# Patient Record
Sex: Female | Born: 1965 | Race: White | Hispanic: No | Marital: Married | Smoking: Never smoker
Health system: Southern US, Community
[De-identification: ages and names within clinical notes are randomized; demographics above are authoritative.]

## PROBLEM LIST (undated history)

## (undated) DIAGNOSIS — R112 Nausea with vomiting, unspecified: Secondary | ICD-10-CM

## (undated) DIAGNOSIS — F419 Anxiety disorder, unspecified: Secondary | ICD-10-CM

## (undated) DIAGNOSIS — Z9889 Other specified postprocedural states: Secondary | ICD-10-CM

## (undated) DIAGNOSIS — G43909 Migraine, unspecified, not intractable, without status migrainosus: Secondary | ICD-10-CM

## (undated) HISTORY — PX: CATARACT EXTRACTION: SUR2

## (undated) HISTORY — PX: CARPAL TUNNEL RELEASE: SHX101

## (undated) HISTORY — PX: OTHER SURGICAL HISTORY: SHX169

## (undated) HISTORY — PX: YAG LASER APPLICATION: SHX6189

---

## 2018-10-18 ENCOUNTER — Ambulatory Visit (HOSPITAL_COMMUNITY)
Admission: RE | Admit: 2018-10-18 | Discharge: 2018-10-18 | Disposition: A | Discharge status: Home / Self Care | Payer: BC Managed Care – PPO | Source: Ambulatory Visit | Attending: Oral Surgery | Admitting: Oral Surgery

## 2018-10-18 ENCOUNTER — Inpatient Hospital Stay (HOSPITAL_COMMUNITY): Payer: BC Managed Care – PPO | Admitting: Certified Registered"

## 2018-10-18 ENCOUNTER — Other Ambulatory Visit: Payer: Self-pay | Admitting: Oral Surgery

## 2018-10-18 ENCOUNTER — Encounter (HOSPITAL_COMMUNITY): Payer: Self-pay | Admitting: *Deleted

## 2018-10-18 ENCOUNTER — Other Ambulatory Visit (HOSPITAL_COMMUNITY)
Admission: RE | Admit: 2018-10-18 | Discharge: 2018-10-18 | Disposition: A | Discharge status: Home / Self Care | Payer: BC Managed Care – PPO | Source: Ambulatory Visit

## 2018-10-18 ENCOUNTER — Other Ambulatory Visit: Payer: Self-pay

## 2018-10-18 ENCOUNTER — Encounter (HOSPITAL_COMMUNITY)
Admission: RE | Disposition: A | Discharge status: Home / Self Care | Payer: Self-pay | Source: Ambulatory Visit | Attending: Oral Surgery

## 2018-10-18 ENCOUNTER — Inpatient Hospital Stay (HOSPITAL_COMMUNITY)
Admission: RE | Admit: 2018-10-18 | Discharge: 2018-10-22 | DRG: 137 | Disposition: A | Discharge status: Home / Self Care | Payer: BC Managed Care – PPO | Source: Ambulatory Visit | Attending: Oral Surgery | Admitting: Oral Surgery

## 2018-10-18 DIAGNOSIS — B9689 Other specified bacterial agents as the cause of diseases classified elsewhere: Secondary | ICD-10-CM | POA: Diagnosis present

## 2018-10-18 DIAGNOSIS — R220 Localized swelling, mass and lump, head: Secondary | ICD-10-CM | POA: Insufficient documentation

## 2018-10-18 DIAGNOSIS — Z20828 Contact with and (suspected) exposure to other viral communicable diseases: Secondary | ICD-10-CM | POA: Diagnosis present

## 2018-10-18 DIAGNOSIS — Z01812 Encounter for preprocedural laboratory examination: Secondary | ICD-10-CM | POA: Insufficient documentation

## 2018-10-18 DIAGNOSIS — K122 Cellulitis and abscess of mouth: Principal | ICD-10-CM | POA: Diagnosis present

## 2018-10-18 DIAGNOSIS — F419 Anxiety disorder, unspecified: Secondary | ICD-10-CM | POA: Diagnosis present

## 2018-10-18 DIAGNOSIS — J390 Retropharyngeal and parapharyngeal abscess: Secondary | ICD-10-CM | POA: Diagnosis present

## 2018-10-18 DIAGNOSIS — Z6841 Body Mass Index (BMI) 40.0 and over, adult: Secondary | ICD-10-CM

## 2018-10-18 HISTORY — DX: Other specified postprocedural states: R11.2

## 2018-10-18 HISTORY — DX: Other specified postprocedural states: Z98.890

## 2018-10-18 HISTORY — PX: INCISION AND DRAINAGE OF PERITONSILLAR ABCESS: SHX6257

## 2018-10-18 HISTORY — DX: Anxiety disorder, unspecified: F41.9

## 2018-10-18 HISTORY — DX: Migraine, unspecified, not intractable, without status migrainosus: G43.909

## 2018-10-18 LAB — SARS CORONAVIRUS 2 BY RT PCR (HOSPITAL ORDER, PERFORMED IN ~~LOC~~ HOSPITAL LAB): SARS Coronavirus 2: NEGATIVE

## 2018-10-18 LAB — CBC
HCT: 38.2 % (ref 36.0–46.0)
Hemoglobin: 13.4 g/dL (ref 12.0–15.0)
MCH: 32.2 pg (ref 26.0–34.0)
MCHC: 35.1 g/dL (ref 30.0–36.0)
MCV: 91.8 fL (ref 80.0–100.0)
Platelets: 367 10*3/uL (ref 150–400)
RBC: 4.16 MIL/uL (ref 3.87–5.11)
RDW: 11.2 % — ABNORMAL LOW (ref 11.5–15.5)
WBC: 17.4 10*3/uL — ABNORMAL HIGH (ref 4.0–10.5)
nRBC: 0 % (ref 0.0–0.2)

## 2018-10-18 LAB — BASIC METABOLIC PANEL
Anion gap: 13 (ref 5–15)
BUN: 11 mg/dL (ref 6–20)
CO2: 26 mmol/L (ref 22–32)
Calcium: 9 mg/dL (ref 8.9–10.3)
Chloride: 90 mmol/L — ABNORMAL LOW (ref 98–111)
Creatinine, Ser: 0.88 mg/dL (ref 0.44–1.00)
GFR calc Af Amer: >60 mL/min (ref >60)
GFR calc non Af Amer: >60 mL/min (ref >60)
Glucose, Bld: 119 mg/dL — ABNORMAL HIGH (ref 70–99)
Potassium: 4 mmol/L (ref 3.5–5.1)
Sodium: 129 mmol/L — ABNORMAL LOW (ref 135–145)

## 2018-10-18 SURGERY — INCISION AND DRAINAGE, ABSCESS, PERITONSILLAR
Anesthesia: General | Site: Face | Laterality: Right

## 2018-10-18 MED ORDER — ESCITALOPRAM OXALATE 20 MG PO TABS
20.0000 mg | ORAL_TABLET | Freq: Every day | ORAL | Status: DC
  Administered 2018-10-19 – 2018-10-21 (×3): 20 mg via ORAL
  Filled 2018-10-18 (×3): qty 1

## 2018-10-18 MED ORDER — SUCCINYLCHOLINE CHLORIDE 20 MG/ML IJ SOLN
INTRAMUSCULAR | Status: DC | PRN
  Administered 2018-10-18: 120 mg via INTRAVENOUS

## 2018-10-18 MED ORDER — ACETAMINOPHEN 10 MG/ML IV SOLN
INTRAVENOUS | Status: AC
  Administered 2018-10-18: 1000 mg via INTRAVENOUS
  Filled 2018-10-18: qty 100

## 2018-10-18 MED ORDER — HYDROMORPHONE HCL 1 MG/ML IJ SOLN
0.5000 mg | INTRAMUSCULAR | Status: DC | PRN
  Administered 2018-10-20 (×2): 0.5 mg via INTRAVENOUS
  Filled 2018-10-18 (×2): qty 1

## 2018-10-18 MED ORDER — PROMETHAZINE HCL 25 MG/ML IJ SOLN: 6.2500 mg | INTRAMUSCULAR | Status: DC | PRN

## 2018-10-18 MED ORDER — DEXAMETHASONE SODIUM PHOSPHATE 10 MG/ML IJ SOLN
INTRAMUSCULAR | Status: DC | PRN
  Administered 2018-10-18: 10 mg via INTRAVENOUS

## 2018-10-18 MED ORDER — LIDOCAINE-EPINEPHRINE 2 %-1:100000 IJ SOLN
INTRAMUSCULAR | Status: DC | PRN
  Administered 2018-10-18: 15 mL via INTRADERMAL

## 2018-10-18 MED ORDER — LIDOCAINE-EPINEPHRINE 1 %-1:100000 IJ SOLN
INTRAMUSCULAR | Status: AC
  Filled 2018-10-18: qty 1

## 2018-10-18 MED ORDER — LORATADINE 10 MG PO TABS
10.0000 mg | ORAL_TABLET | Freq: Every day | ORAL | Status: DC
  Administered 2018-10-19 – 2018-10-21 (×3): 10 mg via ORAL
  Filled 2018-10-18 (×3): qty 1

## 2018-10-18 MED ORDER — FENTANYL CITRATE (PF) 100 MCG/2ML IJ SOLN
INTRAMUSCULAR | Status: AC
  Administered 2018-10-18: 100 ug via INTRAVENOUS
  Filled 2018-10-18: qty 2

## 2018-10-18 MED ORDER — ZOLPIDEM TARTRATE 5 MG PO TABS
5.0000 mg | ORAL_TABLET | Freq: Every evening | ORAL | Status: DC | PRN
  Administered 2018-10-20: 5 mg via ORAL
  Filled 2018-10-18: qty 1

## 2018-10-18 MED ORDER — SUGAMMADEX SODIUM 200 MG/2ML IV SOLN
INTRAVENOUS | Status: DC | PRN
  Administered 2018-10-18: 200 mg via INTRAVENOUS

## 2018-10-18 MED ORDER — SODIUM CHLORIDE (PF) 0.9 % IJ SOLN
INTRAMUSCULAR | Status: AC
  Filled 2018-10-18: qty 50

## 2018-10-18 MED ORDER — DIPHENHYDRAMINE HCL 50 MG/ML IJ SOLN: 25.0000 mg | Freq: Four times a day (QID) | INTRAMUSCULAR | Status: DC | PRN

## 2018-10-18 MED ORDER — DEXAMETHASONE SODIUM PHOSPHATE 10 MG/ML IJ SOLN
INTRAMUSCULAR | Status: AC
  Filled 2018-10-18: qty 1

## 2018-10-18 MED ORDER — METOPROLOL TARTRATE 5 MG/5ML IV SOLN: 5.0000 mg | Freq: Four times a day (QID) | INTRAVENOUS | Status: DC | PRN

## 2018-10-18 MED ORDER — PROPOFOL 10 MG/ML IV BOLUS
INTRAVENOUS | Status: DC | PRN
  Administered 2018-10-18: 200 mg via INTRAVENOUS

## 2018-10-18 MED ORDER — LACTATED RINGERS IV SOLN
INTRAVENOUS | Status: DC | PRN
  Administered 2018-10-18: 18:00:00 via INTRAVENOUS

## 2018-10-18 MED ORDER — ROCURONIUM BROMIDE 100 MG/10ML IV SOLN
INTRAVENOUS | Status: DC | PRN
  Administered 2018-10-18: 30 mg via INTRAVENOUS

## 2018-10-18 MED ORDER — LIDOCAINE-EPINEPHRINE 2 %-1:100000 IJ SOLN
INTRAMUSCULAR | Status: AC
  Filled 2018-10-18: qty 1

## 2018-10-18 MED ORDER — ACETAMINOPHEN 500 MG PO TABS
ORAL_TABLET | ORAL | Status: AC
  Filled 2018-10-18: qty 2

## 2018-10-18 MED ORDER — FENTANYL CITRATE (PF) 250 MCG/5ML IJ SOLN
INTRAMUSCULAR | Status: AC
  Filled 2018-10-18: qty 5

## 2018-10-18 MED ORDER — OXYCODONE HCL 5 MG PO TABS
5.0000 mg | ORAL_TABLET | ORAL | Status: DC | PRN
  Administered 2018-10-18 – 2018-10-22 (×13): 10 mg via ORAL
  Filled 2018-10-18 (×13): qty 2

## 2018-10-18 MED ORDER — SODIUM CHLORIDE 0.9 % IR SOLN
Status: DC | PRN
  Administered 2018-10-18: 1000 mL

## 2018-10-18 MED ORDER — ONDANSETRON 4 MG PO TBDP: 4.0000 mg | ORAL_TABLET | Freq: Four times a day (QID) | ORAL | Status: DC | PRN

## 2018-10-18 MED ORDER — HYDROXYCHLOROQUINE SULFATE 200 MG PO TABS
200.0000 mg | ORAL_TABLET | Freq: Two times a day (BID) | ORAL | Status: DC
  Administered 2018-10-18 – 2018-10-21 (×7): 200 mg via ORAL
  Filled 2018-10-18 (×7): qty 1

## 2018-10-18 MED ORDER — LACTATED RINGERS IV SOLN
INTRAVENOUS | Status: DC
  Administered 2018-10-18: 17:00:00 via INTRAVENOUS

## 2018-10-18 MED ORDER — PROPOFOL 10 MG/ML IV BOLUS
INTRAVENOUS | Status: AC
  Filled 2018-10-18: qty 20

## 2018-10-18 MED ORDER — AMITRIPTYLINE HCL 50 MG PO TABS
50.0000 mg | ORAL_TABLET | Freq: Two times a day (BID) | ORAL | Status: DC
  Administered 2018-10-18 – 2018-10-21 (×7): 50 mg via ORAL
  Filled 2018-10-18 (×7): qty 1

## 2018-10-18 MED ORDER — ONDANSETRON HCL 4 MG/2ML IJ SOLN
INTRAMUSCULAR | Status: AC
  Filled 2018-10-18: qty 4

## 2018-10-18 MED ORDER — ACETAMINOPHEN 500 MG PO TABS
1000.0000 mg | ORAL_TABLET | Freq: Four times a day (QID) | ORAL | Status: DC
  Administered 2018-10-19 – 2018-10-22 (×12): 1000 mg via ORAL
  Filled 2018-10-18 (×13): qty 2

## 2018-10-18 MED ORDER — FENTANYL CITRATE (PF) 100 MCG/2ML IJ SOLN: 25.0000 ug | INTRAMUSCULAR | Status: DC | PRN

## 2018-10-18 MED ORDER — DEXTROSE-NACL 5-0.45 % IV SOLN
INTRAVENOUS | Status: DC
  Administered 2018-10-18 – 2018-10-20 (×4): via INTRAVENOUS

## 2018-10-18 MED ORDER — LORAZEPAM 0.5 MG PO TABS
0.5000 mg | ORAL_TABLET | Freq: Two times a day (BID) | ORAL | Status: DC | PRN
  Administered 2018-10-19 – 2018-10-21 (×3): 0.5 mg via ORAL
  Filled 2018-10-18 (×3): qty 1

## 2018-10-18 MED ORDER — DIPHENHYDRAMINE HCL 25 MG PO CAPS
25.0000 mg | ORAL_CAPSULE | Freq: Four times a day (QID) | ORAL | Status: DC | PRN
  Administered 2018-10-21: 25 mg via ORAL
  Filled 2018-10-18: qty 1

## 2018-10-18 MED ORDER — MIDAZOLAM HCL 2 MG/2ML IJ SOLN
INTRAMUSCULAR | Status: AC
  Filled 2018-10-18: qty 2

## 2018-10-18 MED ORDER — ONDANSETRON HCL 4 MG/2ML IJ SOLN: 4.0000 mg | Freq: Four times a day (QID) | INTRAMUSCULAR | Status: DC | PRN

## 2018-10-18 MED ORDER — FENTANYL CITRATE (PF) 100 MCG/2ML IJ SOLN
100.0000 ug | Freq: Once | INTRAMUSCULAR | Status: AC
  Administered 2018-10-18: 18:00:00 100 ug via INTRAVENOUS

## 2018-10-18 MED ORDER — FENTANYL CITRATE (PF) 250 MCG/5ML IJ SOLN
INTRAMUSCULAR | Status: DC | PRN
  Administered 2018-10-18 (×3): 50 ug via INTRAVENOUS

## 2018-10-18 MED ORDER — ACETAMINOPHEN 10 MG/ML IV SOLN
1000.0000 mg | Freq: Once | INTRAVENOUS | Status: AC
  Administered 2018-10-18: 17:00:00 1000 mg via INTRAVENOUS

## 2018-10-18 MED ORDER — LIDOCAINE 2% (20 MG/ML) 5 ML SYRINGE
INTRAMUSCULAR | Status: DC | PRN
  Administered 2018-10-18: 100 mg via INTRAVENOUS

## 2018-10-18 MED ORDER — ONDANSETRON HCL 4 MG/2ML IJ SOLN
INTRAMUSCULAR | Status: DC | PRN
  Administered 2018-10-18: 4 mg via INTRAVENOUS

## 2018-10-18 MED ORDER — SODIUM CHLORIDE 0.9 % IV SOLN
3.0000 g | Freq: Four times a day (QID) | INTRAVENOUS | Status: DC
  Administered 2018-10-18 – 2018-10-22 (×14): 3 g via INTRAVENOUS
  Filled 2018-10-18: qty 8
  Filled 2018-10-18 (×7): qty 3
  Filled 2018-10-18 (×2): qty 8
  Filled 2018-10-18 (×5): qty 3
  Filled 2018-10-18: qty 8
  Filled 2018-10-18 (×2): qty 3

## 2018-10-18 MED ORDER — IOHEXOL 300 MG/ML  SOLN
75.0000 mL | Freq: Once | INTRAMUSCULAR | Status: AC | PRN
  Administered 2018-10-18: 16:00:00 75 mL via INTRAVENOUS

## 2018-10-18 SURGICAL SUPPLY — 39 items
BLADE SURG 15 STRL LF DISP TIS (BLADE) ×1 IMPLANT
BLADE SURG 15 STRL SS (BLADE) ×2
BNDG CONFORM 2 STRL LF (GAUZE/BANDAGES/DRESSINGS) ×3 IMPLANT
CATH ROBINSON RED A/P 18FR (CATHETERS) ×2 IMPLANT
COVER SURGICAL LIGHT HANDLE (MISCELLANEOUS) ×6 IMPLANT
COVER WAND RF STERILE (DRAPES) ×3 IMPLANT
DRAIN PENROSE 1/4X12 LTX STRL (WOUND CARE) ×3 IMPLANT
DRSG PAD ABDOMINAL 8X10 ST (GAUZE/BANDAGES/DRESSINGS) IMPLANT
ELECT COATED BLADE 2.86 ST (ELECTRODE) IMPLANT
ELECT REM PT RETURN 9FT ADLT (ELECTROSURGICAL) ×3
ELECTRODE REM PT RTRN 9FT ADLT (ELECTROSURGICAL) ×1 IMPLANT
GAUZE 4X4 16PLY RFD (DISPOSABLE) ×3 IMPLANT
GAUZE PACKING FOLDED 2  STR (GAUZE/BANDAGES/DRESSINGS) ×2
GAUZE PACKING FOLDED 2 STR (GAUZE/BANDAGES/DRESSINGS) IMPLANT
GAUZE SPONGE 4X4 12PLY STRL (GAUZE/BANDAGES/DRESSINGS) IMPLANT
GAUZE SPONGE 4X4 12PLY STRL LF (GAUZE/BANDAGES/DRESSINGS) ×2 IMPLANT
GLOVE BIO SURGEON STRL SZ7.5 (GLOVE) ×3 IMPLANT
GOWN STRL REUS W/ TWL LRG LVL3 (GOWN DISPOSABLE) ×1 IMPLANT
GOWN STRL REUS W/TWL LRG LVL3 (GOWN DISPOSABLE) ×2
KIT BASIN OR (CUSTOM PROCEDURE TRAY) ×3 IMPLANT
KIT TURNOVER KIT B (KITS) ×3 IMPLANT
MARKER SKIN DUAL TIP RULER LAB (MISCELLANEOUS) ×3 IMPLANT
NDL HYPO 25GX1X1/2 BEV (NEEDLE) ×1 IMPLANT
NEEDLE HYPO 25GX1X1/2 BEV (NEEDLE) ×3 IMPLANT
NS IRRIG 1000ML POUR BTL (IV SOLUTION) ×3 IMPLANT
PACK SURGICAL SETUP 50X90 (CUSTOM PROCEDURE TRAY) ×3 IMPLANT
PAD ABD 8X10 STRL (GAUZE/BANDAGES/DRESSINGS) ×2 IMPLANT
PAD ARMBOARD 7.5X6 YLW CONV (MISCELLANEOUS) ×6 IMPLANT
PENCIL BUTTON HOLSTER BLD 10FT (ELECTRODE) IMPLANT
POSITIONER HEAD DONUT 9IN (MISCELLANEOUS) IMPLANT
SLEEVE IRRIGATION ELITE 7 (MISCELLANEOUS) IMPLANT
SUT ETHILON 2 0 FS 18 (SUTURE) ×3 IMPLANT
SWAB COLLECTION DEVICE MRSA (MISCELLANEOUS) ×3 IMPLANT
SWAB CULTURE ESWAB REG 1ML (MISCELLANEOUS) ×3 IMPLANT
SYR BULB IRRIGATION 50ML (SYRINGE) ×3 IMPLANT
SYR CONTROL 10ML LL (SYRINGE) ×3 IMPLANT
TAPE CLOTH SURG 4X10 WHT LF (GAUZE/BANDAGES/DRESSINGS) ×2 IMPLANT
TUBE CONNECTING 12'X1/4 (SUCTIONS) ×1
TUBE CONNECTING 12X1/4 (SUCTIONS) ×2 IMPLANT

## 2018-10-18 NOTE — Anesthesia Preprocedure Evaluation (Addendum)
Anesthesia Evaluation  Patient identified by MRN, date of birth, ID band Patient awake    Reviewed: Allergy & Precautions, NPO status , Patient's Chart, lab work & pertinent test results  Airway   TM Distance: <3 FB   Mouth opening: Limited Mouth Opening Comment: Patient unable to open mouth, short TM distance Dental no notable dental hx.    Pulmonary neg pulmonary ROS,    Pulmonary exam normal breath sounds clear to auscultation       Cardiovascular negative cardio ROS Normal cardiovascular exam Rhythm:Regular Rate:Normal     Neuro/Psych Anxiety negative neurological ROS     GI/Hepatic negative GI ROS, Neg liver ROS,   Endo/Other  Morbid obesity  Renal/GU negative Renal ROS  negative genitourinary   Musculoskeletal negative musculoskeletal ROS (+)   Abdominal   Peds negative pediatric ROS (+)  Hematology negative hematology ROS (+)   Anesthesia Other Findings   Reproductive/Obstetrics negative OB ROS                           Anesthesia Physical Anesthesia Plan  ASA: III  Anesthesia Plan: General   Post-op Pain Management:    Induction: Intravenous and Rapid sequence  PONV Risk Score and Plan: 3 and Ondansetron, Dexamethasone and Treatment may vary due to age or medical condition  Airway Management Planned: Oral ETT and Video Laryngoscope Planned  Additional Equipment:   Intra-op Plan:   Post-operative Plan: Extubation in OR  Informed Consent: I have reviewed the patients History and Physical, chart, labs and discussed the procedure including the risks, benefits and alternatives for the proposed anesthesia with the patient or authorized representative who has indicated his/her understanding and acceptance.     Dental advisory given  Plan Discussed with: CRNA and Surgeon  Anesthesia Plan Comments:        Anesthesia Quick Evaluation

## 2018-10-18 NOTE — Anesthesia Postprocedure Evaluation (Signed)
Anesthesia Post Note  Patient: SHACARA COZINE  Procedure(s) Performed: INCISION AND DRAINAGE OF Mandibular/ PERITONSILLAR ABCESS (Right Face)     Patient location during evaluation: PACU Anesthesia Type: General Level of consciousness: awake and alert Pain management: pain level controlled Vital Signs Assessment: post-procedure vital signs reviewed and stable Respiratory status: spontaneous breathing, nonlabored ventilation and respiratory function stable Cardiovascular status: blood pressure returned to baseline, stable and tachycardic Postop Assessment: no apparent nausea or vomiting Anesthetic complications: no    Last Vitals:  Vitals:   10/18/18 2005 10/18/18 2021  BP: (!) 148/74 (!) 142/79  Pulse: (!) 102 (!) 104  Resp: 15 19  Temp:    SpO2: 98% 96%                   Audry Pili

## 2018-10-18 NOTE — Transfer of Care (Signed)
Immediate Anesthesia Transfer of Care Note  Patient: Jody Newton  Procedure(s) Performed: INCISION AND DRAINAGE OF Mandibular/ PERITONSILLAR ABCESS (Right Face)  Patient Location: PACU  Anesthesia Type:General  Level of Consciousness: awake oriented and responsive  Airway & Oxygen Therapy: Patient Spontanous Breathing and Patient connected to face mask oxygen  Post-op Assessment: Report given to RN and Post -op Vital signs reviewed and stable  Post vital signs: Reviewed and stable  Last Vitals:  Vitals Value Taken Time  BP    Temp    Pulse 101 10/18/18 1908  Resp 14 10/18/18 1908  SpO2 100 % 10/18/18 1908  Vitals shown include unvalidated device data.  Last Pain:  Vitals:   10/18/18 1740  TempSrc:   PainSc: 8       Patients Stated Pain Goal: 3 (24/09/73 5329)  Complications: No apparent anesthesia complications

## 2018-10-18 NOTE — H&P (Signed)
HISTORY AND PHYSICAL  Jody Newton is a 53 y.o. female patient with CC: right jaw swelling. Can't open mouth wide.  HPI: Patient began having swelling 3 days ago. Saw general dentist who referred to me for evaluation.   No diagnosis found.  Past Medical History:  Diagnosis Date  . Anxiety   . Migraine   . PONV (postoperative nausea and vomiting)     Current Facility-Administered Medications  Medication Dose Route Frequency Provider Last Rate Last Dose  . acetaminophen (OFIRMEV) IV 1,000 mg  1,000 mg Intravenous Once Eilene Ghaziose, George, MD 400 mL/hr at 10/18/18 1729 1,000 mg at 10/18/18 1729  . fentaNYL (SUBLIMAZE) 100 MCG/2ML injection           . fentaNYL (SUBLIMAZE) injection 100 mcg  100 mcg Intravenous Once Eilene Ghaziose, George, MD      . lactated ringers infusion   Intravenous Continuous Eilene Ghaziose, George, MD 10 mL/hr at 10/18/18 1714     Facility-Administered Medications Ordered in Other Encounters  Medication Dose Route Frequency Provider Last Rate Last Dose  . sodium chloride (PF) 0.9 % injection            No Known Allergies Active Problems:   * No active hospital problems. *  Vitals: Blood pressure (!) 145/58, pulse (!) 110, temperature 98.9 F (37.2 C), temperature source Oral, resp. rate 20, height 5\' 3"  (1.6 m), weight 104.3 kg, SpO2 100 %. Lab results: Results for orders placed or performed during the hospital encounter of 10/18/18 (from the past 24 hour(s))  SARS Coronavirus 2 [facility](Hospital order, Performed in [facility] lab) Nasopharyngeal Nasopharyngeal Swab     Status: None   Collection Time: 10/18/18  3:19 PM   Specimen: Nasopharyngeal Swab  Result Value Ref Range   SARS Coronavirus 2 NEGATIVE NEGATIVE   Radiology Results: Ct Maxillofacial W Contrast  Addendum Date: 10/18/2018   ADDENDUM REPORT: 10/18/2018 16:36 ADDENDUM: The study was discussed by telephone with Dr. Ocie DoyneScott Brenin Heidelberger on 10/18/2018 at 4:28 pm. Electronically Signed   By: Sebastian AcheAllen  Grady M.D.   On:  10/18/2018 16:36   Result Date: 10/18/2018 CLINICAL DATA:  Right submandibular, sublingual, and tonsillar swelling for 3 days with decreased ability to open the mouth. Hit in the face with a baseball several months ago without a reported more recent injury. EXAM: CT MAXILLOFACIAL WITH CONTRAST TECHNIQUE: Multidetector CT imaging of the maxillofacial structures was performed with intravenous contrast. Multiplanar CT image reconstructions were also generated. CONTRAST:  75mL OMNIPAQUE IOHEXOL 300 MG/ML  SOLN COMPARISON:  None. FINDINGS: Osseous: No acute fracture or mandibular dislocation. No evidence of periapical dental abscess. Orbits: Bilateral cataract extraction. Sinuses: Paranasal sinuses and mastoid air cells are clear. Soft tissues: Extensive phlegmonous changes are present posteriorly in the right submandibular space resulting in posterior and medial displacement of the right submandibular gland which does not appear to be the source of the inflammation. A rim enhancing fluid collection along the posterior body and angle of the mandible on the right extends medially and superiorly into the right parapharyngeal space and measures 3.3 x 3.0 x 7.5 cm. There is prominent mass effect on the right lateral oropharynx. The oropharyngeal airway is mildly narrowed but patent. Inflammatory changes involve the right masticator space and overlying subcutaneous facial soft tissues as well. A mildly enlarged right submandibular lymph node measuring 1.3 cm in short axis and right level II lymph nodes measuring up to 1.0 cm are likely reactive. Limited intracranial: Unremarkable. IMPRESSION: Extensive infection with phlegmon centered  in the posterior right submandibular space and a 7.5 cm abscess involving the submandibular and parapharyngeal spaces. These results will be called to the ordering clinician or representative by the Radiologist Assistant, and communication documented in the PACS or zVision Dashboard.  Electronically Signed: By: Logan Bores M.D. On: 10/18/2018 16:11   General appearance: alert, cooperative, no distress and morbidly obese Head: Normocephalic, without obvious abnormality, atraumatic Eyes: negative Nose: Nares normal. Septum midline. Mucosa normal. No drainage or sinus tenderness. Throat: maximum opening approximately 58mm. Palatal and peritonsillar edema with uvula shift to left.  Neck: no adenopathy and moderate to severe submandibular induration Resp: clear to auscultation bilaterally Cardio: regular rate and rhythm, S1, S2 normal, no murmur, click, rub or gallop  Assessment: Right submandibular and parapharyngeal space abscess.  Plan: incision and drainage right submandibular and parapharyngeal space abscess.   Diona Browner 10/18/2018

## 2018-10-18 NOTE — Assessment & Plan Note (Signed)
3 day h/o swelling right jaw with decreased ability to open mouth. Panorex radiograph demonstrates no obvious dental pathology. Plan CT maxillofacial to evaluate.

## 2018-10-18 NOTE — Op Note (Signed)
NAME: Jody Newton, Jody Newton MEDICAL RECORD NI:[id number] ACCOUNT [id number] DATE OF BIRTH:[birth year] FACILITY: [facility] LOCATION: [facility] PHYSICIAN:Noele Icenhour M. Arlesia Kiel, DDS  OPERATIVE REPORT  DATE OF PROCEDURE:  10/18/2018  PREOPERATIVE DIAGNOSIS:  Right submandibular space abscess, right parapharyngeal space abscess.  POSTOPERATIVE DIAGNOSIS:  Right submandibular space abscess, right parapharyngeal space abscess.  PROCEDURE:  Incision and drainage of right submandibular space abscess and right parapharyngeal space abscess.  SURGEON:  Diona Browner, DDS  ANESTHESIA:  General oral intubation.  Dr. Kalman Shan attending.  DESCRIPTION OF PROCEDURE:  The patient was taken to the operating room and placed on the table in supine position.  General anesthesia was administered intravenously.  An oral endotracheal tube was placed and secured.  The eyes were protected.  The  patient was prepped and draped for surgery.  A timeout was performed.  A sterile marking pen was used to mark a proposed incision approximately 2 cm in length approximately 3 cm below the inferior border of the mandible.  Local anesthesia 2% lidocaine  with 1:100,000 epinephrine was infiltrated subcutaneously in this area and tracking along superiorly into the inferior border of the mandible was reached.  A total of 10 mL was utilized.  A 15 blade was used to make an incision through skin and  subcutaneous tissue.  Hemostats were used to bluntly dissect until the inferior border of the mandible was reached.  Kelly forceps were used to dissect further underneath the inferior border of the mandible when a copious amount of purulent material was  encountered.  Aerobic and anaerobic cultures were obtained.  The Claiborne Billings was used to sweep along the inferior border of the mandible and expose loculations medial to the mandible and posteriorly.  Then, when it was felt that all the purulence was  evacuated, a sterile sponge was placed in the incision site  and attention was turned to the oral cavity.  A mouth prop was used to open the mouth and then a throat pack was placed, 2% lidocaine with 1:100,000 epinephrine was infiltrated in the right  peritonsillar fold adjacent to the soft palate.  A total of 5 mL was utilized.  Then, a 15 blade was used to make an incision and hemostats were placed inside the incision site and bluntly dissected until a copious amount of purulent material was  encountered.  Cultures were again obtained aerobic and anaerobic from this area.  Then, a Kelly clamp was used to dissect posteriorly and using digital pressure manually purulent material was expressed.  Then, the area was irrigated copiously with normal  saline and a quarter inch Penrose drain was placed and secured to the mucosa with 3-0 silk.  Then, attention was turned to the extraoral neck incision.  This was irrigated and suctioned and then a quarter inch Penrose drain was placed and sutured to the  skin with 4-0 nylon.  Then, the throat pack was removed.  The bite block was removed.  The patient was left in care of anesthesia for extubation with plans for transportation to recovery room and admission to the floor.  ESTIMATED BLOOD LOSS:  Minimal.  COMPLICATIONS:  None.  SPECIMENS:  None.    CULTURES:  Right submandibular space aerobic and anaerobic, right parapharyngeal space aerobic and anaerobic.  TN/NUANCE  D:10/18/2018 T:10/18/2018 JOB:[id number]

## 2018-10-18 NOTE — Progress Notes (Signed)
3 day h/o swelling right jaw with decreased ability to open mouth. Panorex radiograph demonstrates no obvious dental pathology. Plan CT maxillofacial to evaluate.  

## 2018-10-18 NOTE — Anesthesia Procedure Notes (Signed)
Procedure Name: Intubation Date/Time: 10/18/2018 6:13 PM Performed by: Janene Harvey, CRNA Pre-anesthesia Checklist: Patient identified, Emergency Drugs available, Suction available and Patient being monitored Patient Re-evaluated:Patient Re-evaluated prior to induction Oxygen Delivery Method: Circle system utilized Preoxygenation: Pre-oxygenation with 100% oxygen Induction Type: IV induction Ventilation: Mask ventilation without difficulty and Oral airway inserted - appropriate to patient size Laryngoscope Size: Mac, 3 and Glidescope Grade View: Grade I Tube type: Oral Tube size: 6.0 mm Number of attempts: 1 Airway Equipment and Method: Stylet and Oral airway Placement Confirmation: ETT inserted through vocal cords under direct vision,  positive ETCO2 and breath sounds checked- equal and bilateral Secured at: 21 cm Tube secured with: Tape Dental Injury: Teeth and Oropharynx as per pre-operative assessment

## 2018-10-18 NOTE — Op Note (Signed)
10/18/2018  6:43 PM  PATIENT:  Jody Newton  53 y.o. female  PRE-OPERATIVE DIAGNOSIS:  Right submandibular space abscess, right parapharyngeal space abscess  POST-OPERATIVE DIAGNOSIS:  SAME  PROCEDURE:  Procedure(s): INCISION AND DRAINAGE OF right submandibular space abscess, right parapharyngeal space abscess  SURGEON:  Surgeon(s): Diona Browner, DDS  ANESTHESIA:   local and general  EBL:  minimal  DRAINS: 1. Penrose drain right neck      2. Penrose drain right peritonsillar   SPECIMEN:  None  Cultures Right submandibuar space aerobic and anaerobic; right parapharyngeal space aerobic and anaerobic  COUNTS:  YES  PLAN OF CARE: Discharge to home after PACU  PATIENT DISPOSITION:Admit to 6N   PROCEDURE DETAILS: Dictation #[id number]  Gae Bon, DMD 10/18/2018 6:43 PM

## 2018-10-19 ENCOUNTER — Encounter (HOSPITAL_COMMUNITY): Payer: Self-pay | Admitting: Oral Surgery

## 2018-10-19 DIAGNOSIS — F419 Anxiety disorder, unspecified: Secondary | ICD-10-CM | POA: Diagnosis present

## 2018-10-19 DIAGNOSIS — Z20828 Contact with and (suspected) exposure to other viral communicable diseases: Secondary | ICD-10-CM | POA: Diagnosis present

## 2018-10-19 DIAGNOSIS — Z6841 Body Mass Index (BMI) 40.0 and over, adult: Secondary | ICD-10-CM | POA: Diagnosis not present

## 2018-10-19 DIAGNOSIS — K122 Cellulitis and abscess of mouth: Secondary | ICD-10-CM | POA: Diagnosis present

## 2018-10-19 DIAGNOSIS — B9689 Other specified bacterial agents as the cause of diseases classified elsewhere: Secondary | ICD-10-CM | POA: Diagnosis present

## 2018-10-19 DIAGNOSIS — J390 Retropharyngeal and parapharyngeal abscess: Secondary | ICD-10-CM | POA: Diagnosis present

## 2018-10-19 LAB — CBC
HCT: 36.3 % (ref 36.0–46.0)
Hemoglobin: 12.7 g/dL (ref 12.0–15.0)
MCH: 32.1 pg (ref 26.0–34.0)
MCHC: 35 g/dL (ref 30.0–36.0)
MCV: 91.7 fL (ref 80.0–100.0)
Platelets: 362 10*3/uL (ref 150–400)
RBC: 3.96 MIL/uL (ref 3.87–5.11)
RDW: 11.2 % — ABNORMAL LOW (ref 11.5–15.5)
WBC: 17.2 10*3/uL — ABNORMAL HIGH (ref 4.0–10.5)
nRBC: 0 % (ref 0.0–0.2)

## 2018-10-19 LAB — HIV ANTIBODY (ROUTINE TESTING W REFLEX): HIV Screen 4th Generation wRfx: NONREACTIVE

## 2018-10-19 MED ORDER — ENSURE ENLIVE PO LIQD
237.0000 mL | Freq: Two times a day (BID) | ORAL | Status: DC
  Administered 2018-10-19 – 2018-10-20 (×3): 237 mL via ORAL

## 2018-10-19 NOTE — Progress Notes (Signed)
Patient received from PACU to 6N28. Alert and oriented x4. Pain 0/10, surgical dressing clean, dry and intact. Vital signs stable. Oriented to room and call bell.

## 2018-10-19 NOTE — Progress Notes (Signed)
Dressing to neck soiled and changed per pt request. Pt tolerated well. Will continue to monitor.

## 2018-10-19 NOTE — Plan of Care (Signed)
  Problem: Clinical Measurements: Goal: Postoperative complications will be avoided or minimized Outcome: Progressing   Problem: Skin Integrity: Goal: Demonstration of wound healing without infection will improve Outcome: Progressing   

## 2018-10-19 NOTE — Plan of Care (Signed)
  Problem: Clinical Measurements: Goal: Postoperative complications will be avoided or minimized Outcome: Progressing   

## 2018-10-19 NOTE — Progress Notes (Signed)
Jody Newton PROGRESS NOTE:   SUBJECTIVE: Mild pain. Feeling better since surgery.  OBJECTIVE:  Vitals: Blood pressure 136/73, pulse 99, temperature 97.9 F (36.6 C), temperature source Oral, resp. rate 18, height 5\' 3"  (1.6 m), weight 108.3 kg, SpO2 98 %. Lab results: Results for orders placed or performed during the hospital encounter of 10/18/18 (from the past 24 hour(s))  CBC     Status: Abnormal   Collection Time: 10/18/18  4:56 PM  Result Value Ref Range   WBC 17.4 (H) 4.0 - 10.5 K/uL   RBC 4.16 3.87 - 5.11 MIL/uL   Hemoglobin 13.4 12.0 - 15.0 g/dL   HCT 38.2 36.0 - 46.0 %   MCV 91.8 80.0 - 100.0 fL   MCH 32.2 26.0 - 34.0 pg   MCHC 35.1 30.0 - 36.0 g/dL   RDW 11.2 (L) 11.5 - 15.5 %   Platelets 367 150 - 400 K/uL   nRBC 0.0 0.0 - 0.2 %  Basic metabolic panel     Status: Abnormal   Collection Time: 10/18/18  4:56 PM  Result Value Ref Range   Sodium 129 (L) 135 - 145 mmol/L   Potassium 4.0 3.5 - 5.1 mmol/L   Chloride 90 (L) 98 - 111 mmol/L   CO2 26 22 - 32 mmol/L   Glucose, Bld 119 (H) 70 - 99 mg/dL   BUN 11 6 - 20 mg/dL   Creatinine, Ser 0.88 0.44 - 1.00 mg/dL   Calcium 9.0 8.9 - 10.3 mg/dL   GFR calc non Af Amer >60 >60 mL/min   GFR calc Af Amer >60 >60 mL/min   Anion gap 13 5 - 15  Aerobic/Anaerobic Culture (surgical/deep wound)     Status: None (Preliminary result)   Collection Time: 10/18/18  6:27 PM   Specimen: PATH Bone biopsy; Tissue  Result Value Ref Range   Specimen Description ABSCESS RIGHT MANDIBLE    Special Requests NONE    Gram Stain      ABUNDANT WBC PRESENT, PREDOMINANTLY PMN ABUNDANT GRAM NEGATIVE RODS FEW GRAM VARIABLE ROD RARE GRAM POSITIVE COCCI Performed at [facility], [place] [address], [place], [place] [address]    Culture PENDING    Report Status PENDING   Aerobic/Anaerobic Culture (surgical/deep wound)     Status: None (Preliminary result)   Collection Time: 10/18/18  6:35 PM   Specimen: PATH ENT excision; Tissue  Result  Value Ref Range   Specimen Description ABSCESS RIGHT MOUTH    Special Requests NONE    Gram Stain      ABUNDANT WBC PRESENT, PREDOMINANTLY PMN ABUNDANT GRAM NEGATIVE RODS FEW GRAM POSITIVE COCCI RARE GRAM VARIABLE ROD Performed at [facility], [place] [address], [place], [place] [address]    Culture PENDING    Report Status PENDING   CBC     Status: Abnormal   Collection Time: 10/19/18  4:38 AM  Result Value Ref Range   WBC 17.2 (H) 4.0 - 10.5 K/uL   RBC 3.96 3.87 - 5.11 MIL/uL   Hemoglobin 12.7 12.0 - 15.0 g/dL   HCT 36.3 36.0 - 46.0 %   MCV 91.7 80.0 - 100.0 fL   MCH 32.1 26.0 - 34.0 pg   MCHC 35.0 30.0 - 36.0 g/dL   RDW 11.2 (L) 11.5 - 15.5 %   Platelets 362 150 - 400 K/uL   nRBC 0.0 0.0 - 0.2 %   Radiology Results: Ct Maxillofacial W Contrast  Addendum Date: 10/18/2018  ADDENDUM REPORT: 10/18/2018 16:36 ADDENDUM: The study was discussed by telephone with Dr. Ocie Doyne on 10/18/2018 at 4:28 pm. Electronically Signed   By: Sebastian Ache M.D.   On: 10/18/2018 16:36   Result Date: 10/18/2018 CLINICAL DATA:  Right submandibular, sublingual, and tonsillar swelling for 3 days with decreased ability to open the mouth. Hit in the face with a baseball several months ago without a reported more recent injury. EXAM: CT MAXILLOFACIAL WITH CONTRAST TECHNIQUE: Multidetector CT imaging of the maxillofacial structures was performed with intravenous contrast. Multiplanar CT image reconstructions were also generated. CONTRAST:  97mL OMNIPAQUE IOHEXOL 300 MG/ML  SOLN COMPARISON:  None. FINDINGS: Osseous: No acute fracture or mandibular dislocation. No evidence of periapical dental abscess. Orbits: Bilateral cataract extraction. Sinuses: Paranasal sinuses and mastoid air cells are clear. Soft tissues: Extensive phlegmonous changes are present posteriorly in the right submandibular space resulting in posterior and medial displacement of the right submandibular gland which does not appear to be the  source of the inflammation. A rim enhancing fluid collection along the posterior body and angle of the mandible on the right extends medially and superiorly into the right parapharyngeal space and measures 3.3 x 3.0 x 7.5 cm. There is prominent mass effect on the right lateral oropharynx. The oropharyngeal airway is mildly narrowed but patent. Inflammatory changes involve the right masticator space and overlying subcutaneous facial soft tissues as well. A mildly enlarged right submandibular lymph node measuring 1.3 cm in short axis and right level II lymph nodes measuring up to 1.0 cm are likely reactive. Limited intracranial: Unremarkable. IMPRESSION: Extensive infection with phlegmon centered in the posterior right submandibular space and a 7.5 cm abscess involving the submandibular and parapharyngeal spaces. These results will be called to the ordering clinician or representative by the Radiologist Assistant, and communication documented in the PACS or zVision Dashboard. Electronically Signed: By: Sebastian Ache M.D. On: 10/18/2018 16:11   General appearance: alert, cooperative and no distress Head: Normocephalic, without obvious abnormality, atraumatic Eyes: negative Nose: Nares normal. Septum midline. Mucosa normal. No drainage or sinus tenderness. Throat: trismus to approx 60mm. Drain intact right pharynx.  Neck: supple, symmetrical, trachea midline and Right neck drain intact, minimal drainage. Post-op edema right submandibular. Resp: clear to auscultation bilaterally Cardio: regular rate and rhythm, S1, S2 normal, no murmur, click, rub or gallop  ASSESSMENT: stable s/p I&D right neck, right oropharynx. Cultures pending.  PLAN: Continue IV antibiotics. Push PO intake. Ambulate with assistance.   Ocie Doyne 10/19/2018

## 2018-10-20 LAB — AEROBIC/ANAEROBIC CULTURE W GRAM STAIN (SURGICAL/DEEP WOUND)

## 2018-10-20 LAB — BASIC METABOLIC PANEL
Anion gap: 10 (ref 5–15)
BUN: 7 mg/dL (ref 6–20)
CO2: 28 mmol/L (ref 22–32)
Calcium: 8.3 mg/dL — ABNORMAL LOW (ref 8.9–10.3)
Chloride: 97 mmol/L — ABNORMAL LOW (ref 98–111)
Creatinine, Ser: 0.66 mg/dL (ref 0.44–1.00)
GFR calc Af Amer: >60 mL/min (ref >60)
GFR calc non Af Amer: >60 mL/min (ref >60)
Glucose, Bld: 99 mg/dL (ref 70–99)
Potassium: 3.5 mmol/L (ref 3.5–5.1)
Sodium: 135 mmol/L (ref 135–145)

## 2018-10-20 LAB — CBC
HCT: 35.7 % — ABNORMAL LOW (ref 36.0–46.0)
Hemoglobin: 12 g/dL (ref 12.0–15.0)
MCH: 31.4 pg (ref 26.0–34.0)
MCHC: 33.6 g/dL (ref 30.0–36.0)
MCV: 93.5 fL (ref 80.0–100.0)
Platelets: 384 10*3/uL (ref 150–400)
RBC: 3.82 MIL/uL — ABNORMAL LOW (ref 3.87–5.11)
RDW: 11.4 % — ABNORMAL LOW (ref 11.5–15.5)
WBC: 10.4 10*3/uL (ref 4.0–10.5)
nRBC: 0 % (ref 0.0–0.2)

## 2018-10-20 MED ORDER — ADULT MULTIVITAMIN W/MINERALS CH
1.0000 | ORAL_TABLET | Freq: Every day | ORAL | Status: DC
  Administered 2018-10-20 – 2018-10-21 (×2): 1 via ORAL
  Filled 2018-10-20 (×2): qty 1

## 2018-10-20 MED ORDER — KETOROLAC TROMETHAMINE 30 MG/ML IJ SOLN
30.0000 mg | Freq: Four times a day (QID) | INTRAMUSCULAR | Status: DC | PRN
  Administered 2018-10-20 – 2018-10-21 (×2): 30 mg via INTRAVENOUS
  Filled 2018-10-20 (×2): qty 1

## 2018-10-20 NOTE — Progress Notes (Signed)
Pt complaining of unrelieved pain. Diona Browner, DDS aware. See new orders. Will continue to monitor.

## 2018-10-20 NOTE — Plan of Care (Signed)
  Problem: Clinical Measurements: Goal: Postoperative complications will be avoided or minimized Outcome: Progressing   Problem: Skin Integrity: Goal: Demonstration of wound healing without infection will improve Outcome: Progressing   

## 2018-10-20 NOTE — Discharge Instructions (Signed)
Difficulty Eating Nutrition Therapy Do You Have Difficulty Eating? If you have missing teeth, have pain or other conditions that may make it hard to bite or chew, then you may want to try foods that are easier to eat. This handout provides some tips for you. Try to choose foods from all the food groups each day to get the most nutrition from what you eat. Tips  Eat meat with gravy or sauce.  Cut meat into very small pieces. If small pieces are also difficult to chew, then put the meat into a blender or food processor and grind for easier chewing.  Remember to clean blending equipment with hot soapy water to remove bacteria. Wash the blender when used for uncooked meat before using for foods that are eaten raw like vegetables or fruits to avoid cross-contamination.  Soak dry foods like bread, crackers, or cold cereal in milk until soft enough for easy chewing.  Puddings can be a good source of calories and protein in a small serving, but be sure to rinse your mouth after! Foods Recommended Food Group Servings per Day Examples of Serving Sizes Modified Consistency  Grains/Starches 5-6 (try to make at least half from whole grains) 1 slice of bread  cup cooked rice, cereal, or pasta 1 cup breakfast cereal Dip bread in milk, coffee, or tea Soak cold cereal in milk until soft Eat foods like macaroni and cheese or spaghetti  Vegetables 3-4  cup cooked vegetable 1 cup raw vegetable Cook vegetable until soft Use canned Mash or puree first  Fruits 2-3 1 medium-size fruit  cup canned fruit  cup fruit juice Use canned fruit in its own juices (peaches, pears, fruit cocktail) Choose soft fruits (banana, kiwi, orange, thawed frozen berries) or applesauce  Milk and Milk Products 3 1 cup milk or 1 cup yogurt Make smoothies with milk and fruit blended together Eat cottage cheese, ricotta, or other soft cheeses  Meat and Other Protein Foods 2-3 3 oz meat (the size of a deck of cards) 1 egg  cup  tuna Chop meat into small pieces Eat legumes (black, pinto, garbanzo, kidney beans) Eat soft boiled or baked fish  Difficulty Eating Sample 1-Day Menu View Nutrient Info Breakfast 1/2 cup oatmeal 1 scrambled egg 1/2 banana, mashed 1 cup nonfat milk  Lunch 1/8 cup gravy 1/2 cup well-cooked carrots 1 slice whole-wheat bread, softened in milk 1/2 cup fruit cocktail, in its own juice 1 cup low-fat milk  Evening Meal 1 cup cream of tomato soup 1/2 cup well-cooked broccoli  Evening Snack 1 cup yogurt, non-fruited  Copyright 2020  [employer]. All rights reserved.

## 2018-10-20 NOTE — Plan of Care (Signed)
  Problem: Clinical Measurements: Goal: Postoperative complications will be avoided or minimized Outcome: Progressing   

## 2018-10-20 NOTE — Progress Notes (Signed)
Britnie A Roher PROGRESS NOTE:   SUBJECTIVE: c/o pain in jaw, face 7/10  OBJECTIVE:  Vitals: Blood pressure 129/65, pulse 90, temperature 97.7 F (36.5 C), temperature source Oral, resp. rate 18, height 5\' 3"  (1.6 m), weight 108.3 kg, SpO2 99 %. Lab results: Results for orders placed or performed during the hospital encounter of 10/18/18 (from the past 24 hour(s))  CBC     Status: Abnormal   Collection Time: 10/20/18  5:05 AM  Result Value Ref Range   WBC 10.4 4.0 - 10.5 K/uL   RBC 3.82 (L) 3.87 - 5.11 MIL/uL   Hemoglobin 12.0 12.0 - 15.0 g/dL   HCT 35.7 (L) 36.0 - 46.0 %   MCV 93.5 80.0 - 100.0 fL   MCH 31.4 26.0 - 34.0 pg   MCHC 33.6 30.0 - 36.0 g/dL   RDW 11.4 (L) 11.5 - 15.5 %   Platelets 384 150 - 400 K/uL   nRBC 0.0 0.0 - 0.2 %  Basic metabolic panel     Status: Abnormal   Collection Time: 10/20/18  5:05 AM  Result Value Ref Range   Sodium 135 135 - 145 mmol/L   Potassium 3.5 3.5 - 5.1 mmol/L   Chloride 97 (L) 98 - 111 mmol/L   CO2 28 22 - 32 mmol/L   Glucose, Bld 99 70 - 99 mg/dL   BUN 7 6 - 20 mg/dL   Creatinine, Ser 0.66 0.44 - 1.00 mg/dL   Calcium 8.3 (L) 8.9 - 10.3 mg/dL   GFR calc non Af Amer >60 >60 mL/min   GFR calc Af Amer >60 >60 mL/min   Anion gap 10 5 - 15   Radiology Results: Ct Maxillofacial W Contrast  Addendum Date: 10/18/2018   ADDENDUM REPORT: 10/18/2018 16:36 ADDENDUM: The study was discussed by telephone with Dr. Diona Browner on 10/18/2018 at 4:28 pm. Electronically Signed   By: Logan Bores M.D.   On: 10/18/2018 16:36   Result Date: 10/18/2018 CLINICAL DATA:  Right submandibular, sublingual, and tonsillar swelling for 3 days with decreased ability to open the mouth. Hit in the face with a baseball several months ago without a reported more recent injury. EXAM: CT MAXILLOFACIAL WITH CONTRAST TECHNIQUE: Multidetector CT imaging of the maxillofacial structures was performed with intravenous contrast. Multiplanar CT image reconstructions were also  generated. CONTRAST:  90mL OMNIPAQUE IOHEXOL 300 MG/ML  SOLN COMPARISON:  None. FINDINGS: Osseous: No acute fracture or mandibular dislocation. No evidence of periapical dental abscess. Orbits: Bilateral cataract extraction. Sinuses: Paranasal sinuses and mastoid air cells are clear. Soft tissues: Extensive phlegmonous changes are present posteriorly in the right submandibular space resulting in posterior and medial displacement of the right submandibular gland which does not appear to be the source of the inflammation. A rim enhancing fluid collection along the posterior body and angle of the mandible on the right extends medially and superiorly into the right parapharyngeal space and measures 3.3 x 3.0 x 7.5 cm. There is prominent mass effect on the right lateral oropharynx. The oropharyngeal airway is mildly narrowed but patent. Inflammatory changes involve the right masticator space and overlying subcutaneous facial soft tissues as well. A mildly enlarged right submandibular lymph node measuring 1.3 cm in short axis and right level II lymph nodes measuring up to 1.0 cm are likely reactive. Limited intracranial: Unremarkable. IMPRESSION: Extensive infection with phlegmon centered in the posterior right submandibular space and a 7.5 cm abscess involving the submandibular and parapharyngeal spaces. These results will be called to  the ordering clinician or representative by the Radiologist Assistant, and communication documented in the PACS or zVision Dashboard. Electronically Signed: By: Sebastian Ache M.D. On: 10/18/2018 16:11   General appearance: alert, cooperative, no distress and morbidly obese Head: Normocephalic, without obvious abnormality, atraumatic Eyes: negative Nose: Nares normal. Septum midline. Mucosa normal. No drainage or sinus tenderness. Throat: Trismus to 27mm. Unable to visualize pharynx. Drain intact. No dysphonia. Neck: Drain intact with moderate drainage on bandage. Increased edema  right mandible/submandibular area, tight, non-fluctuant. Resp: clear to auscultation bilaterally Cardio: regular rate and rhythm, S1, S2 normal, no murmur, click, rub or gallop  ASSESSMENT: Stable s/p I&D right neck, pharynx. WBC down from 17 to 10. Post-op edema increased as expected post-op. Tolerating fluids well, unable to eat soft foods well. Ambulating well.  PLAN: Continue IV antibiotics. Cultures pending. Consider D/C am.   Ocie Doyne 10/20/2018

## 2018-10-20 NOTE — Progress Notes (Signed)
Initial Nutrition Assessment  DOCUMENTATION CODES:   Morbid obesity  INTERVENTION:   -Continue Ensure Enlive po BID, each supplement provides 350 kcal and 20 grams of protein -MVI with minerals daily -Downgrade diet to dysphagia 3 (advanced mechanical soft) for ease of intake -Provided "Difficulty Eating Nutrition Therapy" handout from [facility] Nutrition Care Manual; attached to AVS/ discharge summary  NUTRITION DIAGNOSIS:   Inadequate oral intake related to other (see comment)(masticatory difficulty) as evidenced by per patient/family report.  GOAL:   Patient will meet greater than or equal to 90% of their needs  MONITOR:   PO intake, Supplement acceptance, Labs, Weight trends, Skin, I & O's  REASON FOR ASSESSMENT:   Consult Assessment of nutrition requirement/status  ASSESSMENT:   Jody Newton is a 53 y.o. female patient with CC: right jaw swelling. Can't open mouth wide.  Pt admitted with right submandibular and parapharyngeal space abscess.  9/21- s/p Procedure(s): INCISION AND DRAINAGE OF right submandibular space abscess, right parapharyngeal space abscess  Reviewed I/O's: +620 ml x 24 hours and +2.7 L since admission  Case discussed with RN, who reports pt tolerating liquids well. Pt brushed teeth prior to visit without difficulty.   Pt resting quietly in no acute distress upon visit. She did not respond to name being called.   Per MD notes, concern that pt is not taking in a lot of solid foods. She is consuming a lot of liquids. Noted pt is taking Ensure supplements per MAR. Current diet order is a soft diet, which is a surgical soft diet (low fiber, low residue diet designed for pts undergoing GI surgery or GI disease exacerbations). Due to difficulty with mastication, pt would be better suited for a mechanically altered diet. Noted meal completion 100%.  Medications reviewed and include dextrose 5%-0.45% sodium chloride infusion @75  ml/hr.   Labs  reviewed: Na: 129.   NUTRITION - FOCUSED PHYSICAL EXAM:    Most Recent Value  Orbital Region  No depletion  Upper Arm Region  No depletion  Thoracic and Lumbar Region  No depletion  Buccal Region  No depletion  Temple Region  No depletion  Clavicle Bone Region  No depletion  Clavicle and Acromion Bone Region  No depletion  Scapular Bone Region  No depletion  Dorsal Hand  No depletion  Patellar Region  No depletion  Anterior Thigh Region  No depletion  Posterior Calf Region  No depletion  Edema (RD Assessment)  None  Hair  Reviewed  Eyes  Reviewed  Mouth  Reviewed  Skin  Reviewed  Nails  Reviewed       Diet Order:   Diet Order            DIET SOFT Room service appropriate? Yes; Fluid consistency: Thin  Diet effective now              EDUCATION NEEDS:   No education needs have been identified at this time  Skin:  Skin Assessment: Skin Integrity Issues: Skin Integrity Issues:: Incisions Incisions: rt face  Last BM:  10/16/18  Height:   Ht Readings from Last 1 Encounters:  10/18/18 5\' 3"  (1.6 m)    Weight:   Wt Readings from Last 1 Encounters:  10/18/18 108.3 kg    Ideal Body Weight:  52.3 kg  BMI:  Body mass index is 42.29 kg/m.  Estimated Nutritional Needs:   Kcal:  1600-1800  Protein:  105-120 grams  Fluid:  > 1.6 L    Alisa Stjames A. Jimmye Norman, RD,  LDN, Horace Registered Dietitian II Certified Diabetes Care and Education Specialist Pager: [phone] After hours Pager: [phone]

## 2018-10-21 LAB — CBC
HCT: 34.7 % — ABNORMAL LOW (ref 36.0–46.0)
Hemoglobin: 12 g/dL (ref 12.0–15.0)
MCH: 32.1 pg (ref 26.0–34.0)
MCHC: 34.6 g/dL (ref 30.0–36.0)
MCV: 92.8 fL (ref 80.0–100.0)
Platelets: 390 10*3/uL (ref 150–400)
RBC: 3.74 MIL/uL — ABNORMAL LOW (ref 3.87–5.11)
RDW: 11.3 % — ABNORMAL LOW (ref 11.5–15.5)
WBC: 7.6 10*3/uL (ref 4.0–10.5)
nRBC: 0 % (ref 0.0–0.2)

## 2018-10-21 MED ORDER — CHLORHEXIDINE GLUCONATE 0.12 % MT SOLN
15.0000 mL | Freq: Two times a day (BID) | OROMUCOSAL | Status: DC
  Administered 2018-10-21 (×2): 15 mL via OROMUCOSAL
  Filled 2018-10-21 (×2): qty 15

## 2018-10-21 MED ORDER — INFLUENZA VAC SPLIT QUAD 0.5 ML IM SUSY: 0.5000 mL | PREFILLED_SYRINGE | INTRAMUSCULAR | Status: DC

## 2018-10-21 NOTE — Progress Notes (Signed)
Rhetta Mura PROGRESS NOTE:   SUBJECTIVE: Feeling better. Less pain.   OBJECTIVE:  Vitals: Blood pressure (!) 150/85, pulse 93, temperature 98 F (36.7 C), temperature source Oral, resp. rate 16, height 5\' 3"  (1.6 m), weight 108.3 kg, SpO2 98 %. Lab results: Results for orders placed or performed during the hospital encounter of 10/18/18 (from the past 24 hour(s))  CBC     Status: Abnormal   Collection Time: 10/21/18  4:16 AM  Result Value Ref Range   WBC 7.6 4.0 - 10.5 K/uL   RBC 3.74 (L) 3.87 - 5.11 MIL/uL   Hemoglobin 12.0 12.0 - 15.0 g/dL   HCT 34.7 (L) 36.0 - 46.0 %   MCV 92.8 80.0 - 100.0 fL   MCH 32.1 26.0 - 34.0 pg   MCHC 34.6 30.0 - 36.0 g/dL   RDW 11.3 (L) 11.5 - 15.5 %   Platelets 390 150 - 400 K/uL   nRBC 0.0 0.0 - 0.2 %   Radiology Results: No results found. General appearance: alert, cooperative, no distress and morbidly obese Head: Normocephalic, without obvious abnormality, atraumatic Eyes: negative Nose: Nares normal. Septum midline. Mucosa normal. No drainage or sinus tenderness. Throat: trismus to 1 cm. Drain intact right oropharynx.  Neck: no adenopathy and Moderate to severe right submandibular edema. Dressing saturated with drainage.  Resp: clear to auscultation bilaterally Cardio: regular rate and rhythm, S1, S2 normal, no murmur, click, rub or gallop  ASSESSMENT: Remains afebrile, white count decreased to normal. Wounds draining. Cultures pending, anaerobes suspected.   PLAN: Continue Unasyn, PO's, ambulation. Warm salt water mouth rinses.    Diona Browner 10/21/2018

## 2018-10-21 NOTE — TOC Initial Note (Signed)
Transition of Care [facility]) - Initial/Assessment Note    Patient Details  Name: Jody Newton MRN: [id number] Date of Birth: 1965/12/29  [facility]) CM/SW Contact:    Jody Favre, RN Phone Number: 10/21/2018, 11:35 AM  Clinical Narrative:                  Patient from home with husband. Has transportation to follow up appointments. Has prescription coverage and can afford co pays.   Patient active with Dr Tonita Cong for PCP and DR Alease Frame .   Will continue to follow for discharge needs. Expected Discharge Plan: Home/Self Care Barriers to Discharge: Continued Medical Work up   Patient Goals and CMS Choice Patient states their goals for this hospitalization and ongoing recovery are:: to go home CMS Medicare.gov Compare Post Acute Care list provided to:: Patient Choice offered to / list presented to : NA  Expected Discharge Plan and Services Expected Discharge Plan: Home/Self Care       Living arrangements for the past 2 months: Single Family Home                 DME Arranged: N/A         HH Arranged: NA          Prior Living Arrangements/Services Living arrangements for the past 2 months: Single Family Home Lives with:: Significant Other Patient language and need for interpreter reviewed:: Yes Do you feel safe going back to the place where you live?: Yes      Need for Family Participation in Patient Care: No (Comment) Care giver support system in place?: Yes (comment)   Criminal Activity/Legal Involvement Pertinent to Current Situation/Hospitalization: No - Comment as needed  Activities of Daily Living Home Assistive Devices/Equipment: None ADL Screening (condition at time of admission) Patient's cognitive ability adequate to safely complete daily activities?: Yes Is the patient deaf or have difficulty hearing?: No Does the patient have difficulty seeing, even when wearing glasses/contacts?: No Does the patient have difficulty concentrating,  remembering, or making decisions?: No Patient able to express need for assistance with ADLs?: Yes Does the patient have difficulty dressing or bathing?: No Independently performs ADLs?: Yes (appropriate for developmental age) Does the patient have difficulty walking or climbing stairs?: No Weakness of Legs: None Weakness of Arms/Hands: None  Permission Sought/Granted                  Emotional Assessment Appearance:: Appears stated age Attitude/Demeanor/Rapport: Engaged Affect (typically observed): Accepting Orientation: : Oriented to Self, Oriented to Place, Oriented to  Time, Oriented to Situation Alcohol / Substance Use: Not Applicable Psych Involvement: No (comment)  Admission diagnosis:  infected right mandible Patient Active Problem List   Diagnosis Date Noted  . Swelling of mandible 10/18/2018  . Submandibular space infection 10/18/2018   PCP:  Manfred Shirts, PA Pharmacy:   [id number] - [place], [place] [address] [place] [address] Phone: [phone] Fax: [phone]     Social Determinants of Health (SDOH) Interventions    Readmission Risk Interventions No flowsheet data found.

## 2018-10-22 MED ORDER — OXYCODONE HCL 5 MG PO TABS: 5.0000 mg | ORAL_TABLET | ORAL | 0 refills | Status: AC | PRN

## 2018-10-22 MED ORDER — KETOROLAC TROMETHAMINE 10 MG PO TABS: 10.0000 mg | ORAL_TABLET | Freq: Four times a day (QID) | ORAL | 0 refills | Status: AC | PRN

## 2018-10-22 MED ORDER — CHLORHEXIDINE GLUCONATE 0.12 % MT SOLN: 15.0000 mL | Freq: Two times a day (BID) | OROMUCOSAL | 1 refills | Status: AC

## 2018-10-22 MED ORDER — AMOXICILLIN-POT CLAVULANATE 875-125 MG PO TABS: 1.0000 | ORAL_TABLET | Freq: Two times a day (BID) | ORAL | 0 refills | Status: AC

## 2018-10-22 NOTE — Plan of Care (Signed)
  Problem: Education: ?Goal: Required Educational Video(s) ?Outcome: Completed/Met ?  ?Problem: Clinical Measurements: ?Goal: Postoperative complications will be avoided or minimized ?Outcome: Completed/Met ?  ?Problem: Skin Integrity: ?Goal: Demonstration of wound healing without infection will improve ?Outcome: Completed/Met ?  ?

## 2018-10-22 NOTE — Progress Notes (Signed)
Rhetta Mura to be D/C'd  per MD order. Discussed with the patient and all questions fully answered.  VSS, Skin clean, dry and intact without evidence of skin break down, no evidence of skin tears noted.  IV catheter discontinued intact. Site without signs and symptoms of complications. Dressing and pressure applied.  An After Visit Summary was printed and given to the patient. Patient received prescription.  D/c education completed with patient/family including follow up instructions, medication list, d/c activities limitations if indicated, with other d/c instructions as indicated by MD - patient able to verbalize understanding, all questions fully answered.   Patient instructed to return to ED, call 911, or call MD for any changes in condition.   Patient to be escorted via [place], and D/C home via private auto.

## 2018-10-23 LAB — AEROBIC/ANAEROBIC CULTURE W GRAM STAIN (SURGICAL/DEEP WOUND)

## 2018-10-25 NOTE — Discharge Summary (Signed)
Patient ID: Jody Newton MRN: [id number] DOB/AGE: [birth year] 53 y.o.  Admit date: 10/18/2018 Discharge date: 10/25/2018  Admission Diagnoses: Right submandibular space infection, right parapharyngeal space infection  Discharge Diagnoses:  Active Problems:   Submandibular space infection   Discharged Condition: good  Hospital Course: Admitted 10/18/2018 for deep space infection. Underwent surgery that same day. Received IV antibiotics until discharge.  Consults: None  Significant Diagnostic Studies: labs: , microbiology: wound culture: positive for PREVOTELLA MELANINOGENICA and radiology: CT scan:       Treatments: IV hydration, antibiotics: Unasyn, analgesia: Dilaudid and surgery:   Discharge Exam: Blood pressure (!) 152/80, pulse 74, temperature 98.3 F (36.8 C), temperature source Oral, resp. rate 14, height 5\' 3"  (1.6 m), weight 108.3 kg, SpO2 98 %. Exam recorded as separate note day of discharge.  Disposition:   Discharge Instructions    Call MD for:  difficulty breathing, headache or visual disturbances   Complete by: As directed    Call MD for:  persistant nausea and vomiting   Complete by: As directed    Call MD for:  severe uncontrolled pain   Complete by: As directed    Call MD for:  temperature >100.4   Complete by: As directed    Diet - low sodium heart healthy   Complete by: As directed    Soft Diet. Advance as tolerated.   Discharge instructions   Complete by: As directed    Warm compresses to affected area for 2-3 days. Soft diet, advance as tolerated. Warm salt water mouth rinses 4-5 times per day starting the day after surgery. For problems, call [phone]. Follow-up Monday at 7:30am at Dr Lupita Leash office or call for appointment   Increase activity slowly   Complete by: As directed      Allergies as of 10/22/2018   No Known Allergies     Medication List    TAKE these medications   amitriptyline 50 MG tablet Commonly known as:  ELAVIL Take 50 mg by mouth 2 (two) times daily.   amoxicillin-clavulanate 875-125 MG tablet Commonly known as: Augmentin Take 1 tablet by mouth 2 (two) times daily.   chlorhexidine 0.12 % solution Commonly known as: PERIDEX Use as directed 15 mLs in the mouth or throat 2 (two) times daily.   escitalopram 20 MG tablet Commonly known as: LEXAPRO Take 20 mg by mouth daily.   hydroxychloroquine 200 MG tablet Commonly known as: PLAQUENIL Take 200 mg by mouth 2 (two) times daily.   ketorolac 10 MG tablet Commonly known as: TORADOL Take 1 tablet (10 mg total) by mouth every 6 (six) hours as needed.   levocetirizine 5 MG tablet Commonly known as: XYZAL Take 5 mg by mouth daily.   LORazepam 0.5 MG tablet Commonly known as: ATIVAN Take 0.5 mg by mouth 2 (two) times daily as needed for anxiety. insomnia   oxyCODONE 5 MG immediate release tablet Commonly known as: Oxy IR/ROXICODONE Take 1-2 tablets (5-10 mg total) by mouth every 4 (four) hours as needed for moderate pain.        Signed: Diona Browner 10/25/2018, 1:01 PM

## 2020-10-14 IMAGING — CT CT MAXILLOFACIAL W/ CM
3 series · 14 of 47 positions shown, 16 images · IV contrast (omnipaque)
Comparison: None.
COMPARISON: None.

Addendum:
CLINICAL DATA: Right submandibular, sublingual, and tonsillar
swelling for 3 days with decreased ability to open the mouth. Hit in
the face with a baseball several months ago without a reported more
recent injury.

EXAM:
CT MAXILLOFACIAL WITH CONTRAST
TECHNIQUE: Multidetector CT imaging of the maxillofacial structures was
performed with intravenous contrast. Multiplanar CT image
reconstructions were also generated.
CONTRAST:  75mL OMNIPAQUE IOHEXOL 300 MG/ML  SOLN

[Series 3: max soft · axial · 0.33mm/px · z∈[+1482,+1658]mm · 8 of 102 slices shown, 10 images]
[im 7/102  brain]
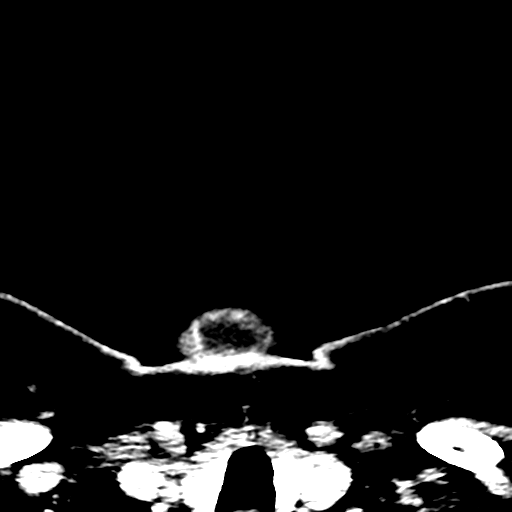
[im 7/102  bone]
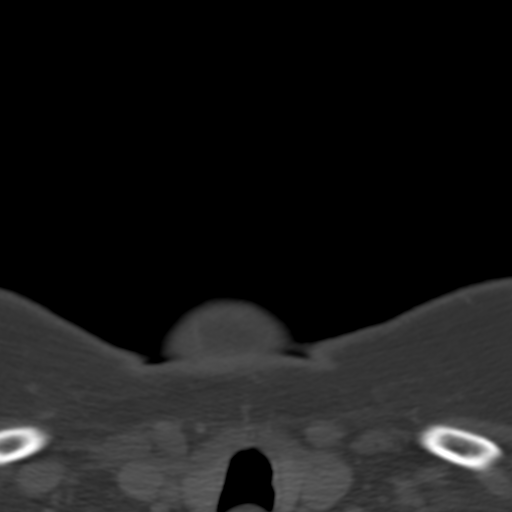
[im 21/102  bone]
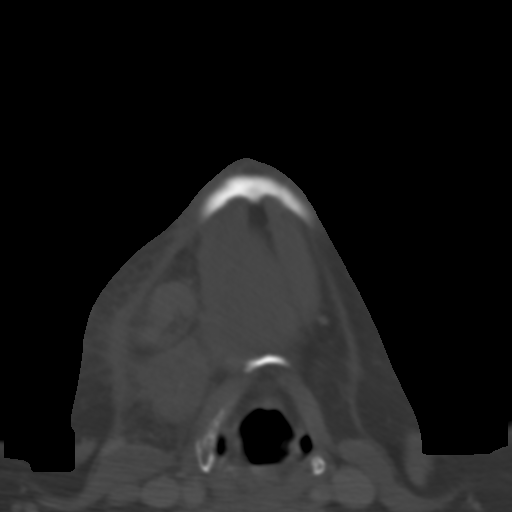
[im 32/102  bone]
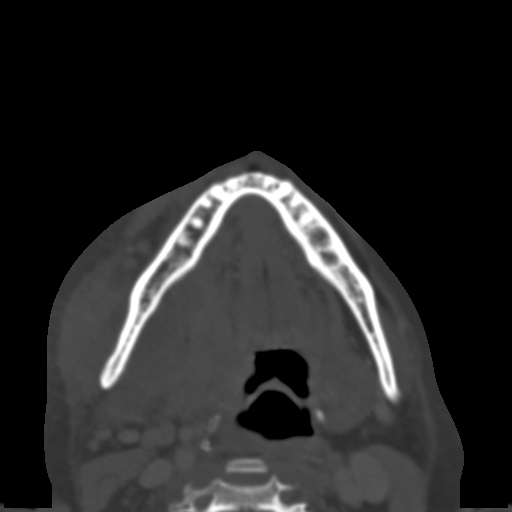
[im 46/102  bone]
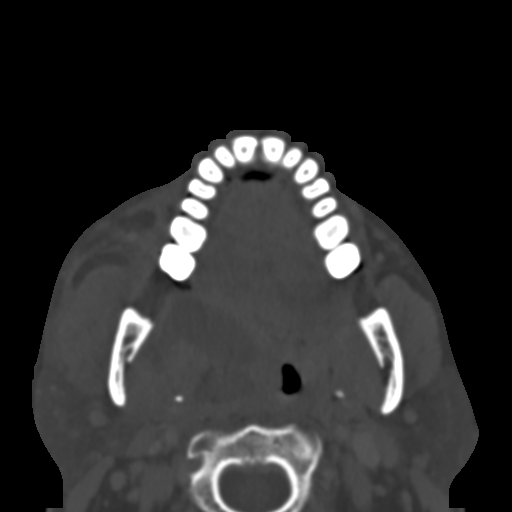
[im 56/102  brain]
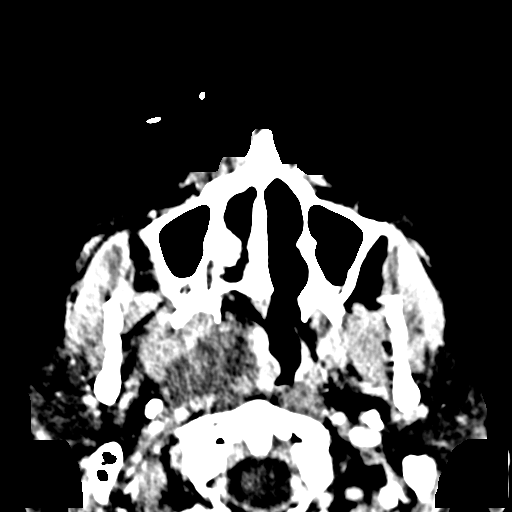
[im 56/102  bone]
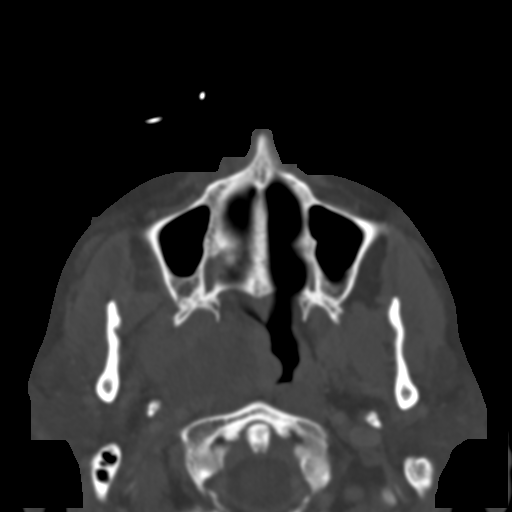
[im 70/102  bone]
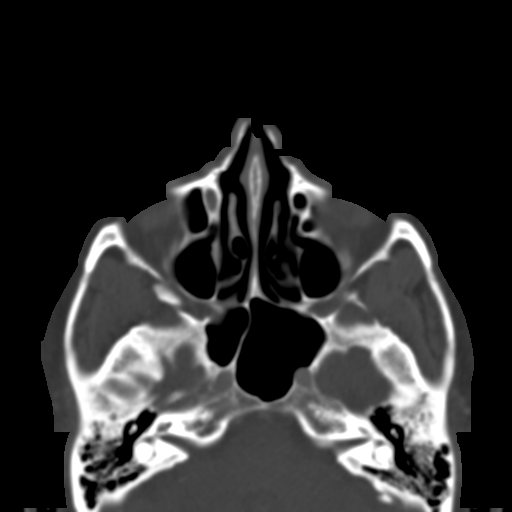
[im 81/102  bone]
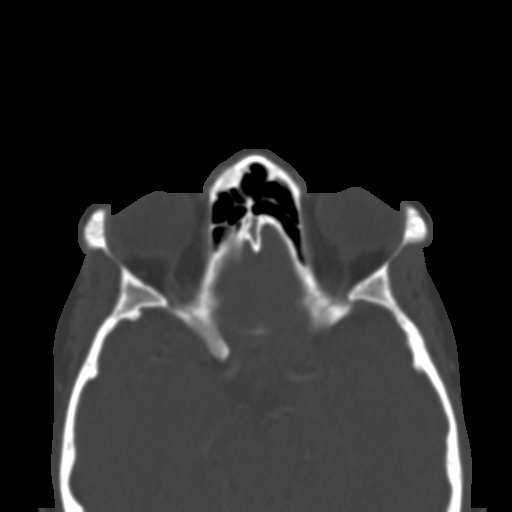
[im 95/102  bone]
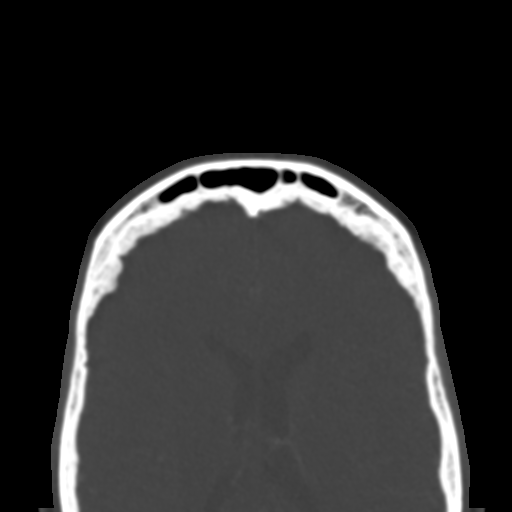

[Series 6: coronal soft · coronal · 0.35mm/px · 3 of 79 slices shown]
[im 27/79  bone]
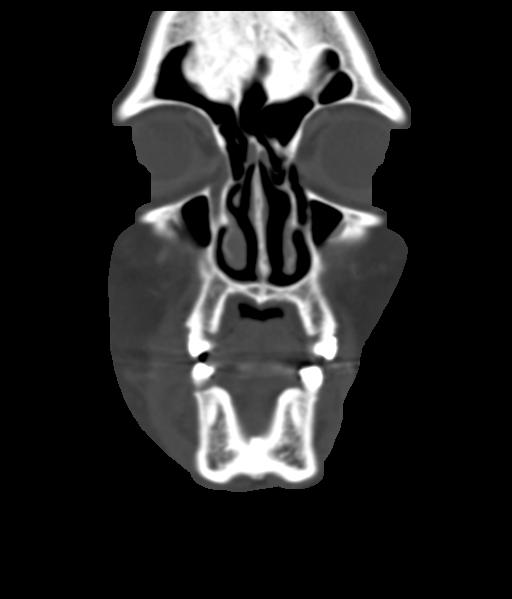
[im 35/79  bone]
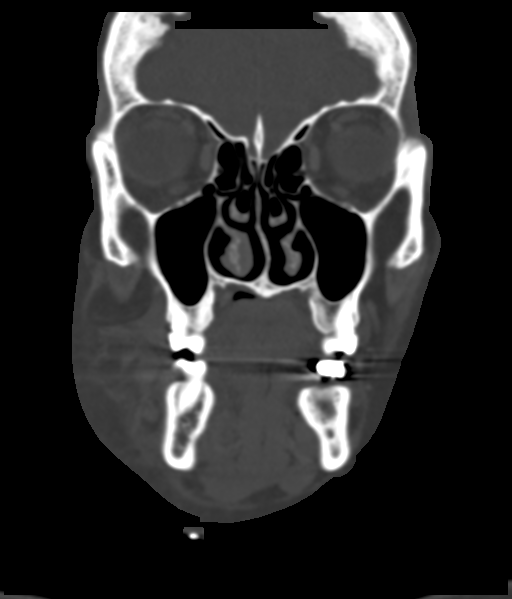
[im 44/79  bone]
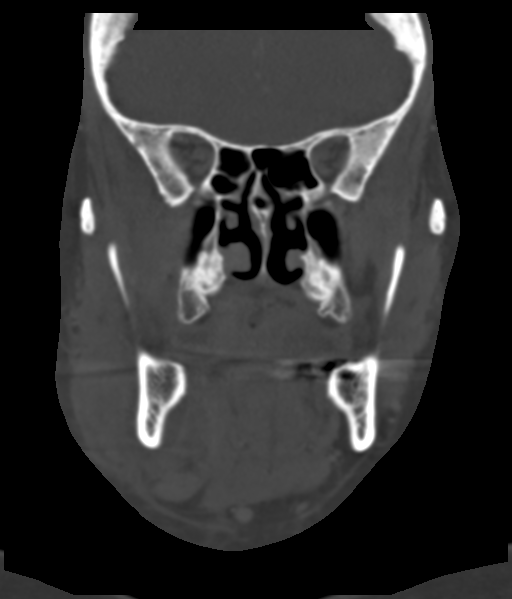

[Series 7: sagittal soft · sagittal · 0.36mm/px · 3 of 85 slices shown]
[im 29/85  bone]
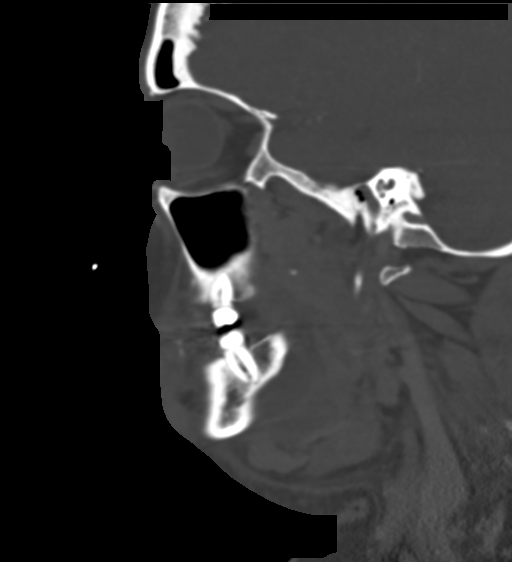
[im 43/85  bone]
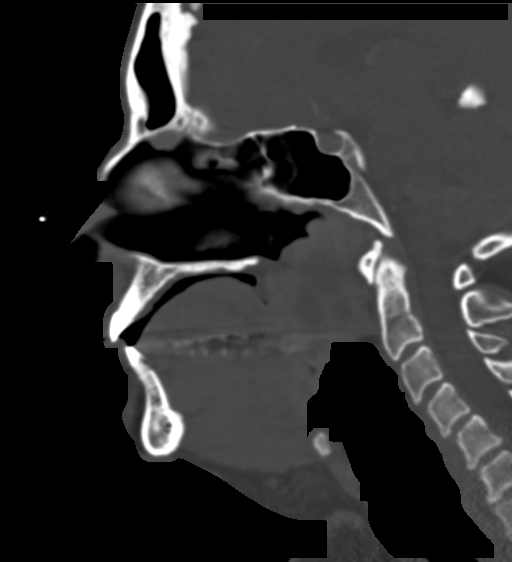
[im 57/85  bone]
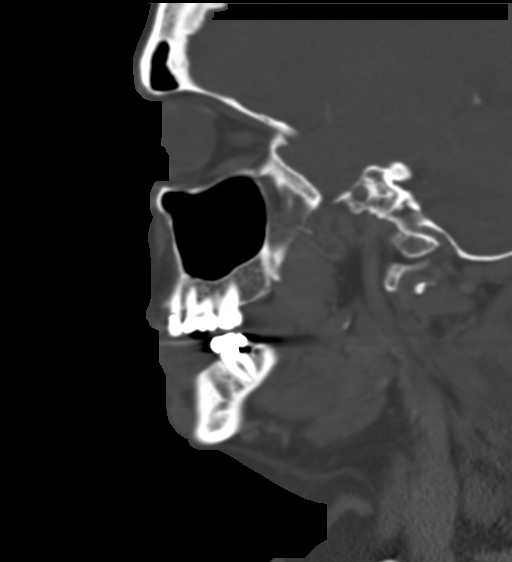

[14 of 47 positions shown; findings below may reference images not displayed]

FINDINGS: Osseous: No acute fracture or mandibular dislocation. No evidence of
periapical dental abscess.

Orbits: Bilateral cataract extraction.

Sinuses: Paranasal sinuses and mastoid air cells are clear.

Soft tissues: Extensive phlegmonous changes are present posteriorly
in the right submandibular space resulting in posterior and medial
displacement of the right submandibular gland which does not appear
to be the source of the inflammation. A rim enhancing fluid
collection along the posterior body and angle of the mandible on the
right extends medially and superiorly into the right parapharyngeal
space and measures 3.3 x 3.0 x 7.5 cm. There is prominent mass
effect on the right lateral oropharynx. The oropharyngeal airway is
mildly narrowed but patent. Inflammatory changes involve the right
masticator space and overlying subcutaneous facial soft tissues as
well. A mildly enlarged right submandibular lymph node measuring
cm in short axis and right level II lymph nodes measuring up to
cm are likely reactive.

Limited intracranial: Unremarkable.
IMPRESSION: Extensive infection with phlegmon centered in the posterior right
submandibular space and a 7.5 cm abscess involving the submandibular
and parapharyngeal spaces.

These results will be called to the ordering clinician or
representative by the Radiologist Assistant, and communication
documented in the PACS or zVision Dashboard.

ADDENDUM:
The study was discussed by telephone with Dr. Jimmy Dario Wil on
10/18/2018 at [DATE].

*** End of Addendum ***
FINDINGS: Osseous: No acute fracture or mandibular dislocation. No evidence of
periapical dental abscess.

Orbits: Bilateral cataract extraction.

Sinuses: Paranasal sinuses and mastoid air cells are clear.

Soft tissues: Extensive phlegmonous changes are present posteriorly
in the right submandibular space resulting in posterior and medial
displacement of the right submandibular gland which does not appear
to be the source of the inflammation. A rim enhancing fluid
collection along the posterior body and angle of the mandible on the
right extends medially and superiorly into the right parapharyngeal
space and measures 3.3 x 3.0 x 7.5 cm. There is prominent mass
effect on the right lateral oropharynx. The oropharyngeal airway is
mildly narrowed but patent. Inflammatory changes involve the right
masticator space and overlying subcutaneous facial soft tissues as
well. A mildly enlarged right submandibular lymph node measuring
cm in short axis and right level II lymph nodes measuring up to
cm are likely reactive.

Limited intracranial: Unremarkable.
IMPRESSION: Extensive infection with phlegmon centered in the posterior right
submandibular space and a 7.5 cm abscess involving the submandibular
and parapharyngeal spaces.

These results will be called to the ordering clinician or
representative by the Radiologist Assistant, and communication
documented in the PACS or zVision Dashboard.
